# Patient Record
Sex: Male | Born: 1960
Health system: Southern US, Community
[De-identification: ages and names within clinical notes are randomized; demographics above are authoritative.]

## PROBLEM LIST (undated history)

## (undated) ENCOUNTER — Emergency Department: Admission: EM | Source: Home / Self Care

## (undated) DIAGNOSIS — E669 Obesity, unspecified: Secondary | ICD-10-CM

## (undated) DIAGNOSIS — E785 Hyperlipidemia, unspecified: Secondary | ICD-10-CM

## (undated) DIAGNOSIS — E119 Type 2 diabetes mellitus without complications: Secondary | ICD-10-CM

## (undated) DIAGNOSIS — K219 Gastro-esophageal reflux disease without esophagitis: Secondary | ICD-10-CM

## (undated) DIAGNOSIS — I10 Essential (primary) hypertension: Secondary | ICD-10-CM

## (undated) DIAGNOSIS — J439 Emphysema, unspecified: Secondary | ICD-10-CM

## (undated) HISTORY — PX: KNEE ARTHROSCOPY: SUR90

## (undated) HISTORY — DX: Hyperlipidemia, unspecified: E78.5

## (undated) HISTORY — DX: Essential (primary) hypertension: I10

## (undated) HISTORY — DX: Emphysema, unspecified: J43.9

---

## 2004-03-14 HISTORY — PX: CHOLECYSTECTOMY: SHX55

## 2006-06-13 ENCOUNTER — Ambulatory Visit: Payer: Self-pay | Admitting: Family Medicine

## 2006-06-20 ENCOUNTER — Ambulatory Visit: Payer: Self-pay | Admitting: General Surgery

## 2013-04-08 ENCOUNTER — Ambulatory Visit: Payer: Self-pay | Admitting: Gastroenterology

## 2017-05-17 DIAGNOSIS — N182 Chronic kidney disease, stage 2 (mild): Secondary | ICD-10-CM | POA: Diagnosis not present

## 2017-05-17 DIAGNOSIS — I10 Essential (primary) hypertension: Secondary | ICD-10-CM | POA: Diagnosis not present

## 2017-05-17 DIAGNOSIS — E1129 Type 2 diabetes mellitus with other diabetic kidney complication: Secondary | ICD-10-CM | POA: Diagnosis not present

## 2017-05-24 DIAGNOSIS — E119 Type 2 diabetes mellitus without complications: Secondary | ICD-10-CM | POA: Diagnosis not present

## 2017-06-07 DIAGNOSIS — N182 Chronic kidney disease, stage 2 (mild): Secondary | ICD-10-CM | POA: Diagnosis not present

## 2017-06-07 DIAGNOSIS — K219 Gastro-esophageal reflux disease without esophagitis: Secondary | ICD-10-CM | POA: Diagnosis not present

## 2017-06-07 DIAGNOSIS — E1129 Type 2 diabetes mellitus with other diabetic kidney complication: Secondary | ICD-10-CM | POA: Diagnosis not present

## 2017-06-07 DIAGNOSIS — I10 Essential (primary) hypertension: Secondary | ICD-10-CM | POA: Diagnosis not present

## 2017-06-07 DIAGNOSIS — E7849 Other hyperlipidemia: Secondary | ICD-10-CM | POA: Diagnosis not present

## 2017-06-07 DIAGNOSIS — Z1389 Encounter for screening for other disorder: Secondary | ICD-10-CM | POA: Diagnosis not present

## 2017-07-18 DIAGNOSIS — E1129 Type 2 diabetes mellitus with other diabetic kidney complication: Secondary | ICD-10-CM | POA: Diagnosis not present

## 2017-07-18 DIAGNOSIS — I10 Essential (primary) hypertension: Secondary | ICD-10-CM | POA: Diagnosis not present

## 2017-07-18 DIAGNOSIS — N182 Chronic kidney disease, stage 2 (mild): Secondary | ICD-10-CM | POA: Diagnosis not present

## 2017-07-18 DIAGNOSIS — F17210 Nicotine dependence, cigarettes, uncomplicated: Secondary | ICD-10-CM | POA: Diagnosis not present

## 2017-10-04 DIAGNOSIS — E7849 Other hyperlipidemia: Secondary | ICD-10-CM | POA: Diagnosis not present

## 2017-10-04 DIAGNOSIS — I10 Essential (primary) hypertension: Secondary | ICD-10-CM | POA: Diagnosis not present

## 2017-10-04 DIAGNOSIS — E1129 Type 2 diabetes mellitus with other diabetic kidney complication: Secondary | ICD-10-CM | POA: Diagnosis not present

## 2017-10-04 DIAGNOSIS — N182 Chronic kidney disease, stage 2 (mild): Secondary | ICD-10-CM | POA: Diagnosis not present

## 2018-02-05 DIAGNOSIS — E7849 Other hyperlipidemia: Secondary | ICD-10-CM | POA: Diagnosis not present

## 2018-02-05 DIAGNOSIS — R740 Nonspecific elevation of levels of transaminase and lactic acid dehydrogenase [LDH]: Secondary | ICD-10-CM | POA: Diagnosis not present

## 2018-02-05 DIAGNOSIS — E1129 Type 2 diabetes mellitus with other diabetic kidney complication: Secondary | ICD-10-CM | POA: Diagnosis not present

## 2018-02-05 DIAGNOSIS — I10 Essential (primary) hypertension: Secondary | ICD-10-CM | POA: Diagnosis not present

## 2018-02-05 DIAGNOSIS — N182 Chronic kidney disease, stage 2 (mild): Secondary | ICD-10-CM | POA: Diagnosis not present

## 2018-03-13 DIAGNOSIS — K429 Umbilical hernia without obstruction or gangrene: Secondary | ICD-10-CM | POA: Diagnosis not present

## 2018-03-22 ENCOUNTER — Ambulatory Visit: Payer: Self-pay | Admitting: Surgery

## 2018-03-22 NOTE — H&P (Signed)
Subjective:   CC: umbilical hernia  HPI:  Joshua Weber is a 58 y.o. male who was referred by Lucas MallowKhary Stephen Carew, MD for evaluation of above. Symptoms were first noted a few weeks ago. Asymptomatic.  Noticed the bulge a few days after moving furniture.  Patient has no symptoms of  chronic constipation.    Past Medical History:  has a past medical history of Esophageal reflux and Obesity.  Past Surgical History:       Past Surgical History:  Procedure Laterality Date  . CHOLECYSTECTOMY    . KNEE ARTHROSCOPY Bilateral 1994, 1995   by Dr. Gerrit Heckaliff    Family History: family history includes Cancer in his father; High blood pressure (Hypertension) in his father.  Social History:  reports that he has been smoking. He has been smoking about 1.00 pack per day. He has never used smokeless tobacco. He reports current alcohol use. He reports that he does not use drugs.  Current Medications: has a current medication list which includes the following prescription(s): atorvastatin, azelastine, duke's magic mouthwash, fluticasone propionate, losartan, and metformin.  Allergies:     Allergies as of 03/21/2018  . (No Known Allergies)    ROS:  A 15 point review of systems was performed and pertinent positives and negatives noted in HPI   Objective:   BP 133/87   Pulse 78   Temp 36.4 C (97.6 F) (Oral)   Ht 193 cm (6\' 4" )   Wt (!) 135.6 kg (299 lb)   BMI 36.40 kg/m   Constitutional :  alert, appears stated age, cooperative and no distress  Lymphatics/Throat:  no asymmetry, masses, or scars  Respiratory:  clear to auscultation bilaterally  Cardiovascular:  regular rate and rhythm  Gastrointestinal: soft, non-tender; bowel sounds normal; no masses,  no organomegaly. umbilical hernia noted.  moderate and reducible  Musculoskeletal: Steady gait and movement  Skin: Cool and moist  Psychiatric: Normal affect, non-agitated, not confused       LABS:  n/a    RADS: n/a Assessment:       Umbilical hernia without obstruction and without gangrene [K42.9]  Obesity Smoking DM  Plan:   1. Umbilical hernia without obstruction and without gangrene [K42.9]   Discussed the risk of surgery including recurrence, which can be up to 50% in the case of incisional or complex hernias, possible use of prosthetic materials (mesh) and the increased risk of mesh infxn if used, bleeding, chronic pain, post-op infxn, post-op SBO or ileus, and possible re-operation to address said risks. The risks of general anesthetic, if used, includes MI, CVA, sudden death or even reaction to anesthetic medications also discussed. Alternatives include continued observation.  Benefits include possible symptom relief, prevention of incarceration, strangulation, enlargement in size over time, and the risk of emergency surgery in the face of strangulation.   Typical post-op recovery time of 3-5 days with 4-6 weeks of activity restrictions were also discussed.  ED return precautions given for sudden increase in pain, size of hernia with accompanying fever, nausea, and/or vomiting.  The patient verbalized understanding and all questions were answered to the patient's satisfaction.   2. I specifically stated he is at higher risk of complications including recurrence due to his current wt, DM, and smoking history.  I initially recommended postponing the surgery to see if we can continue to lose weight(states he has lost 30lbs over the past several months), try to quit smoking, and also re-evalute to see if his DM is medically optimized.  Pt had zero interest in waiting and was concerned that the hernia will become symptomatic so requested that we proceed with surgery now, specifically stating he understands the higher risk of complications in his current state.  Will proceed with scheduling at this time       Electronically signed by Sung Amabile, DO on 03/21/2018 10:03 AM

## 2018-03-22 NOTE — H&P (View-Only) (Signed)
Subjective:   CC: umbilical hernia  HPI:  Joshua Weber is a 58 y.o. male who was referred by Khary Stephen Carew, MD for evaluation of above. Symptoms were first noted a few weeks ago. Asymptomatic.  Noticed the bulge a few days after moving furniture.  Patient has no symptoms of  chronic constipation.    Past Medical History:  has a past medical history of Esophageal reflux and Obesity.  Past Surgical History:       Past Surgical History:  Procedure Laterality Date  . CHOLECYSTECTOMY    . KNEE ARTHROSCOPY Bilateral 1994, 1995   by Dr. Califf    Family History: family history includes Cancer in his father; High blood pressure (Hypertension) in his father.  Social History:  reports that he has been smoking. He has been smoking about 1.00 pack per day. He has never used smokeless tobacco. He reports current alcohol use. He reports that he does not use drugs.  Current Medications: has a current medication list which includes the following prescription(s): atorvastatin, azelastine, duke's magic mouthwash, fluticasone propionate, losartan, and metformin.  Allergies:     Allergies as of 03/21/2018  . (No Known Allergies)    ROS:  A 15 point review of systems was performed and pertinent positives and negatives noted in HPI   Objective:   BP 133/87   Pulse 78   Temp 36.4 C (97.6 F) (Oral)   Ht 193 cm (6' 4")   Wt (!) 135.6 kg (299 lb)   BMI 36.40 kg/m   Constitutional :  alert, appears stated age, cooperative and no distress  Lymphatics/Throat:  no asymmetry, masses, or scars  Respiratory:  clear to auscultation bilaterally  Cardiovascular:  regular rate and rhythm  Gastrointestinal: soft, non-tender; bowel sounds normal; no masses,  no organomegaly. umbilical hernia noted.  moderate and reducible  Musculoskeletal: Steady gait and movement  Skin: Cool and moist  Psychiatric: Normal affect, non-agitated, not confused       LABS:  n/a    RADS: n/a Assessment:       Umbilical hernia without obstruction and without gangrene [K42.9]  Obesity Smoking DM  Plan:   1. Umbilical hernia without obstruction and without gangrene [K42.9]   Discussed the risk of surgery including recurrence, which can be up to 50% in the case of incisional or complex hernias, possible use of prosthetic materials (mesh) and the increased risk of mesh infxn if used, bleeding, chronic pain, post-op infxn, post-op SBO or ileus, and possible re-operation to address said risks. The risks of general anesthetic, if used, includes MI, CVA, sudden death or even reaction to anesthetic medications also discussed. Alternatives include continued observation.  Benefits include possible symptom relief, prevention of incarceration, strangulation, enlargement in size over time, and the risk of emergency surgery in the face of strangulation.   Typical post-op recovery time of 3-5 days with 4-6 weeks of activity restrictions were also discussed.  ED return precautions given for sudden increase in pain, size of hernia with accompanying fever, nausea, and/or vomiting.  The patient verbalized understanding and all questions were answered to the patient's satisfaction.   2. I specifically stated he is at higher risk of complications including recurrence due to his current wt, DM, and smoking history.  I initially recommended postponing the surgery to see if we can continue to lose weight(states he has lost 30lbs over the past several months), try to quit smoking, and also re-evalute to see if his DM is medically optimized.    Pt had zero interest in waiting and was concerned that the hernia will become symptomatic so requested that we proceed with surgery now, specifically stating he understands the higher risk of complications in his current state.  Will proceed with scheduling at this time       Electronically signed by Sung Amabile, DO on 03/21/2018 10:03 AM

## 2018-03-28 ENCOUNTER — Encounter
Admission: RE | Admit: 2018-03-28 | Discharge: 2018-03-28 | Disposition: A | Discharge status: Home / Self Care | Payer: PRIVATE HEALTH INSURANCE | Source: Ambulatory Visit | Attending: Surgery | Admitting: Surgery

## 2018-03-28 ENCOUNTER — Other Ambulatory Visit: Payer: Self-pay

## 2018-03-28 DIAGNOSIS — Z01818 Encounter for other preprocedural examination: Secondary | ICD-10-CM | POA: Diagnosis present

## 2018-03-28 DIAGNOSIS — E119 Type 2 diabetes mellitus without complications: Secondary | ICD-10-CM | POA: Diagnosis not present

## 2018-03-28 HISTORY — DX: Gastro-esophageal reflux disease without esophagitis: K21.9

## 2018-03-28 HISTORY — DX: Type 2 diabetes mellitus without complications: E11.9

## 2018-03-28 HISTORY — DX: Obesity, unspecified: E66.9

## 2018-03-28 LAB — CBC
HCT: 48.6 % (ref 39.0–52.0)
Hemoglobin: 16 g/dL (ref 13.0–17.0)
MCH: 30.4 pg (ref 26.0–34.0)
MCHC: 32.9 g/dL (ref 30.0–36.0)
MCV: 92.4 fL (ref 80.0–100.0)
Platelets: 328 10*3/uL (ref 150–400)
RBC: 5.26 MIL/uL (ref 4.22–5.81)
RDW: 13 % (ref 11.5–15.5)
WBC: 8.1 10*3/uL (ref 4.0–10.5)
nRBC: 0 % (ref 0.0–0.2)

## 2018-03-28 LAB — BASIC METABOLIC PANEL
Anion gap: 8 (ref 5–15)
BUN: 23 mg/dL — ABNORMAL HIGH (ref 6–20)
CO2: 23 mmol/L (ref 22–32)
Calcium: 9.3 mg/dL (ref 8.9–10.3)
Chloride: 107 mmol/L (ref 98–111)
Creatinine, Ser: 0.96 mg/dL (ref 0.61–1.24)
GFR calc Af Amer: >60 mL/min (ref >60)
GFR calc non Af Amer: >60 mL/min (ref >60)
Glucose, Bld: 177 mg/dL — ABNORMAL HIGH (ref 70–99)
Potassium: 3.9 mmol/L (ref 3.5–5.1)
Sodium: 138 mmol/L (ref 135–145)

## 2018-03-28 NOTE — Patient Instructions (Addendum)
Your procedure is scheduled on: Tuesday, April 03, 2018  Report to THE SECOND FLOOR OF THE MEDICAL MALL     DO NOT STOP ON THE FIRST FLOOR TO REGISTER  To find out your arrival time please call [phone] between 1PM - 3PM on Monday, April 02, 2018  Remember: Instructions that are not followed completely may result in serious medical risk,  up to and including death, or upon the discretion of your surgeon and anesthesiologist your  surgery may need to be rescheduled.     _X__ 1. Do not eat food after midnight the night before your procedure.                 No gum chewing or hard candies.                       ABSOLUTELY NOTHING SOLID IN YOUR MOUTH AFTER MIDNIGHT                 You may drink clear liquids up to 2 hours before you are scheduled to arrive for your surgery-                   DO not drink clear liquids within 2 hours of the start of your surgery.                  Clear Liquids include:  water, apple juice without pulp, clear carbohydrate                 drink such as Clearfast of Gatorade, Black Coffee or Tea (Do not add                 anything to coffee or tea).YOU MAY ADD SUGAR BUT DO NOT ADD DAIRY PRODUCTS  __X__2.  On the morning of surgery brush your teeth with toothpaste and water,                     You may rinse your mouth with mouthwash if you wish.                           Do not swallow any toothpaste of mouthwash.     _X__ 3.  No Alcohol for 24 hours before or after surgery.   _X__ 4.  Do Not Smoke or use e-cigarettes For 24 Hours Prior to Your Surgery.                 Do not use any chewable tobacco products for at least 6 hours prior to                 surgery.  ____  5.  Bring all medications with you on the day of surgery if instructed.   ____  6.  Notify your doctor if there is any change in your medical condition      (cold, fever, infections).     Do not wear jewelry, make-up, hairpins, clips or nail polish. Do  not wear lotions, powders, or perfumes. You may wear deodorant. Do not shave 48 hours prior to surgery. Men may shave face and neck. Do not bring valuables to the hospital.    [facility] is not responsible for any belongings or valuables.  Contacts, dentures or bridgework may not be worn into surgery. Leave your suitcase in the car. After surgery it may be brought to your room. For  patients admitted to the hospital, discharge time is determined by your treatment team.   Patients discharged the day of surgery will not be allowed to drive home.   Please read over the following fact sheets that you were given:   PREPARING FOR SURGERY   _X___ Take these medicines the morning of surgery with A SIP OF WATER:    1.NEXIUM  2. LIPITOR  3. VASCEPA  4.  5.  6.  ____ Fleet Enema (as directed)   __X__ Use CHG Soap as directed  ____ Use inhalers on the day of surgery  _X___ [facility] 2 [facility] to [facility]   ____ Take 1/2 of usual insulin dose the night before surgery. No insulin the morning          of surgery.   _X___ Stop ALL ASPIRIN PRODUCTS AS OF NOW  _X___ Stop Anti-inflammatories AS OF NOW                THAT INCLUDES IBUPROFEN / MOTRIN / ADVIL / ALEVE / NAPROXEN                                YOU MAY TAKE TYLENOL AT ANY TIME   ____ Stop supplements until after surgery.    ____ Bring C-Pap to the hospital.   DO NOT TAKE LOSARTAN ON THE DAY OF SURGERY BUT DO TAKE IT EVERY DAY UP UNTIL THEN.  WEAR LOOSE AND COMFORTABLE CLOTHES

## 2018-04-02 MED ORDER — CEFAZOLIN SODIUM-DEXTROSE 2-4 GM/100ML-% IV SOLN
2.0000 g | INTRAVENOUS | Status: AC
  Administered 2018-04-03: 2 g via INTRAVENOUS

## 2018-04-03 ENCOUNTER — Encounter
Admission: RE | Disposition: A | Discharge status: Home / Self Care | Payer: Self-pay | Source: Ambulatory Visit | Attending: Surgery

## 2018-04-03 ENCOUNTER — Ambulatory Visit: Payer: PRIVATE HEALTH INSURANCE | Admitting: Registered Nurse

## 2018-04-03 ENCOUNTER — Ambulatory Visit
Admission: RE | Admit: 2018-04-03 | Discharge: 2018-04-03 | Disposition: A | Discharge status: Home / Self Care | Payer: PRIVATE HEALTH INSURANCE | Source: Ambulatory Visit | Attending: Surgery | Admitting: Surgery

## 2018-04-03 ENCOUNTER — Other Ambulatory Visit: Payer: Self-pay

## 2018-04-03 ENCOUNTER — Encounter: Payer: Self-pay | Admitting: *Deleted

## 2018-04-03 DIAGNOSIS — K219 Gastro-esophageal reflux disease without esophagitis: Secondary | ICD-10-CM | POA: Diagnosis not present

## 2018-04-03 DIAGNOSIS — E669 Obesity, unspecified: Secondary | ICD-10-CM | POA: Diagnosis not present

## 2018-04-03 DIAGNOSIS — F17210 Nicotine dependence, cigarettes, uncomplicated: Secondary | ICD-10-CM | POA: Insufficient documentation

## 2018-04-03 DIAGNOSIS — K429 Umbilical hernia without obstruction or gangrene: Secondary | ICD-10-CM | POA: Insufficient documentation

## 2018-04-03 DIAGNOSIS — Z6836 Body mass index (BMI) 36.0-36.9, adult: Secondary | ICD-10-CM | POA: Insufficient documentation

## 2018-04-03 DIAGNOSIS — Z8249 Family history of ischemic heart disease and other diseases of the circulatory system: Secondary | ICD-10-CM | POA: Diagnosis not present

## 2018-04-03 DIAGNOSIS — Z7984 Long term (current) use of oral hypoglycemic drugs: Secondary | ICD-10-CM | POA: Diagnosis not present

## 2018-04-03 DIAGNOSIS — Z79899 Other long term (current) drug therapy: Secondary | ICD-10-CM | POA: Diagnosis not present

## 2018-04-03 DIAGNOSIS — Z7951 Long term (current) use of inhaled steroids: Secondary | ICD-10-CM | POA: Diagnosis not present

## 2018-04-03 DIAGNOSIS — E119 Type 2 diabetes mellitus without complications: Secondary | ICD-10-CM | POA: Diagnosis not present

## 2018-04-03 HISTORY — PX: UMBILICAL HERNIA REPAIR: SHX196

## 2018-04-03 LAB — GLUCOSE, CAPILLARY
Glucose-Capillary: 130 mg/dL — ABNORMAL HIGH (ref 70–99)
Glucose-Capillary: 151 mg/dL — ABNORMAL HIGH (ref 70–99)

## 2018-04-03 SURGERY — REPAIR, HERNIA, UMBILICAL, ADULT
Anesthesia: General

## 2018-04-03 MED ORDER — SUCCINYLCHOLINE CHLORIDE 20 MG/ML IJ SOLN
INTRAMUSCULAR | Status: DC | PRN
  Administered 2018-04-03: 200 mg via INTRAVENOUS

## 2018-04-03 MED ORDER — ROCURONIUM BROMIDE 50 MG/5ML IV SOLN
INTRAVENOUS | Status: AC
  Filled 2018-04-03: qty 1

## 2018-04-03 MED ORDER — ACETAMINOPHEN 500 MG PO TABS
ORAL_TABLET | ORAL | Status: AC
  Administered 2018-04-03: 1000 mg via ORAL
  Filled 2018-04-03: qty 2

## 2018-04-03 MED ORDER — DOCUSATE SODIUM 100 MG PO CAPS: 100.0000 mg | ORAL_CAPSULE | Freq: Two times a day (BID) | ORAL | 0 refills | Status: AC | PRN

## 2018-04-03 MED ORDER — DEXAMETHASONE SODIUM PHOSPHATE 10 MG/ML IJ SOLN
INTRAMUSCULAR | Status: DC | PRN
  Administered 2018-04-03: 4 mg via INTRAVENOUS

## 2018-04-03 MED ORDER — PROPOFOL 10 MG/ML IV BOLUS
INTRAVENOUS | Status: AC
  Filled 2018-04-03: qty 20

## 2018-04-03 MED ORDER — DEXAMETHASONE SODIUM PHOSPHATE 10 MG/ML IJ SOLN
INTRAMUSCULAR | Status: AC
  Filled 2018-04-03: qty 1

## 2018-04-03 MED ORDER — ALBUTEROL SULFATE HFA 108 (90 BASE) MCG/ACT IN AERS
INHALATION_SPRAY | RESPIRATORY_TRACT | Status: DC | PRN
  Administered 2018-04-03: 4 via RESPIRATORY_TRACT

## 2018-04-03 MED ORDER — CEFAZOLIN SODIUM-DEXTROSE 2-4 GM/100ML-% IV SOLN
INTRAVENOUS | Status: AC
  Filled 2018-04-03: qty 100

## 2018-04-03 MED ORDER — FENTANYL CITRATE (PF) 250 MCG/5ML IJ SOLN
INTRAMUSCULAR | Status: AC
  Filled 2018-04-03: qty 5

## 2018-04-03 MED ORDER — ACETAMINOPHEN 500 MG PO TABS
1000.0000 mg | ORAL_TABLET | ORAL | Status: AC
  Administered 2018-04-03: 1000 mg via ORAL

## 2018-04-03 MED ORDER — PROPOFOL 10 MG/ML IV BOLUS
INTRAVENOUS | Status: DC | PRN
  Administered 2018-04-03 (×2): 200 mg via INTRAVENOUS

## 2018-04-03 MED ORDER — FENTANYL CITRATE (PF) 100 MCG/2ML IJ SOLN
INTRAMUSCULAR | Status: DC | PRN
  Administered 2018-04-03 (×2): 100 ug via INTRAVENOUS

## 2018-04-03 MED ORDER — ALBUTEROL SULFATE HFA 108 (90 BASE) MCG/ACT IN AERS
INHALATION_SPRAY | RESPIRATORY_TRACT | Status: AC
  Filled 2018-04-03: qty 6.7

## 2018-04-03 MED ORDER — EPHEDRINE SULFATE 50 MG/ML IJ SOLN
INTRAMUSCULAR | Status: DC | PRN
  Administered 2018-04-03 (×3): 5 mg via INTRAVENOUS

## 2018-04-03 MED ORDER — LIDOCAINE HCL (PF) 2 % IJ SOLN
INTRAMUSCULAR | Status: AC
  Filled 2018-04-03: qty 10

## 2018-04-03 MED ORDER — ACETAMINOPHEN 325 MG PO TABS: 650.0000 mg | ORAL_TABLET | Freq: Three times a day (TID) | ORAL | 0 refills | Status: AC | PRN

## 2018-04-03 MED ORDER — BUPIVACAINE LIPOSOME 1.3 % IJ SUSP
INTRAMUSCULAR | Status: DC | PRN
  Administered 2018-04-03: 20 mL

## 2018-04-03 MED ORDER — SODIUM CHLORIDE 0.9 % IV SOLN
INTRAVENOUS | Status: DC
  Administered 2018-04-03: 16:00:00 via INTRAVENOUS

## 2018-04-03 MED ORDER — ONDANSETRON HCL 4 MG/2ML IJ SOLN
INTRAMUSCULAR | Status: AC
  Filled 2018-04-03: qty 2

## 2018-04-03 MED ORDER — LIDOCAINE HCL (PF) 4 % IJ SOLN
INTRAMUSCULAR | Status: DC | PRN
  Administered 2018-04-03: 3 mL via RESPIRATORY_TRACT

## 2018-04-03 MED ORDER — IBUPROFEN 800 MG PO TABS: 800.0000 mg | ORAL_TABLET | Freq: Three times a day (TID) | ORAL | 0 refills | Status: AC | PRN

## 2018-04-03 MED ORDER — HYDROCODONE-ACETAMINOPHEN 5-325 MG PO TABS: 1.0000 | ORAL_TABLET | Freq: Four times a day (QID) | ORAL | 0 refills | Status: AC | PRN

## 2018-04-03 MED ORDER — MIDAZOLAM HCL 2 MG/2ML IJ SOLN
INTRAMUSCULAR | Status: DC | PRN
  Administered 2018-04-03: 2 mg via INTRAVENOUS

## 2018-04-03 MED ORDER — PHENYLEPHRINE HCL 10 MG/ML IJ SOLN
INTRAMUSCULAR | Status: DC | PRN
  Administered 2018-04-03 (×3): 100 ug via INTRAVENOUS
  Administered 2018-04-03 (×3): 200 ug via INTRAVENOUS

## 2018-04-03 MED ORDER — BUPIVACAINE-EPINEPHRINE (PF) 0.5% -1:200000 IJ SOLN
INTRAMUSCULAR | Status: DC | PRN
  Administered 2018-04-03: 25 mL via PERINEURAL
  Administered 2018-04-03: 5 mL via PERINEURAL

## 2018-04-03 MED ORDER — MIDAZOLAM HCL 2 MG/2ML IJ SOLN
INTRAMUSCULAR | Status: AC
  Filled 2018-04-03: qty 2

## 2018-04-03 MED ORDER — LIDOCAINE HCL (CARDIAC) PF 100 MG/5ML IV SOSY
PREFILLED_SYRINGE | INTRAVENOUS | Status: DC | PRN
  Administered 2018-04-03: 100 mg via INTRAVENOUS

## 2018-04-03 MED ORDER — ONDANSETRON HCL 4 MG/2ML IJ SOLN
INTRAMUSCULAR | Status: DC | PRN
  Administered 2018-04-03: 4 mg via INTRAVENOUS

## 2018-04-03 MED ORDER — HYDROMORPHONE HCL 1 MG/ML IJ SOLN: 0.2500 mg | INTRAMUSCULAR | Status: DC | PRN

## 2018-04-03 MED ORDER — CHLORHEXIDINE GLUCONATE CLOTH 2 % EX PADS: 6.0000 | MEDICATED_PAD | Freq: Once | CUTANEOUS | Status: DC

## 2018-04-03 SURGICAL SUPPLY — 33 items
BLADE SURG 15 STRL LF DISP TIS (BLADE) ×1 IMPLANT
BLADE SURG 15 STRL SS (BLADE) ×1
CANISTER SUCT 1200ML W/VALVE (MISCELLANEOUS) ×2 IMPLANT
CHLORAPREP W/TINT 26ML (MISCELLANEOUS) ×2 IMPLANT
COVER WAND RF STERILE (DRAPES) ×1 IMPLANT
DERMABOND ADVANCED (GAUZE/BANDAGES/DRESSINGS) ×1
DERMABOND ADVANCED .7 DNX12 (GAUZE/BANDAGES/DRESSINGS) ×1 IMPLANT
DRAPE LAPAROTOMY 77X122 PED (DRAPES) ×2 IMPLANT
ELECT REM PT RETURN 9FT ADLT (ELECTROSURGICAL) ×2
ELECTRODE REM PT RTRN 9FT ADLT (ELECTROSURGICAL) ×1 IMPLANT
GLOVE BIOGEL PI IND STRL 7.0 (GLOVE) ×1 IMPLANT
GLOVE BIOGEL PI INDICATOR 7.0 (GLOVE) ×1
GLOVE SURG SYN 7.0 (GLOVE) ×2 IMPLANT
GLOVE SURG SYN 7.0 PF PI (GLOVE) ×1 IMPLANT
GOWN STRL REUS W/ TWL LRG LVL3 (GOWN DISPOSABLE) ×3 IMPLANT
GOWN STRL REUS W/TWL LRG LVL3 (GOWN DISPOSABLE) ×3
KIT TURNOVER KIT A (KITS) ×2 IMPLANT
LABEL OR SOLS (LABEL) ×2 IMPLANT
MESH VENTRALEX ST 2.5 CRC MED (Mesh General) ×1 IMPLANT
NEEDLE HYPO 22GX1.5 SAFETY (NEEDLE) ×2 IMPLANT
NS IRRIG 500ML POUR BTL (IV SOLUTION) ×2 IMPLANT
PACK BASIN MINOR ARMC (MISCELLANEOUS) ×2 IMPLANT
SUT ETHIBOND NAB MO 7 #0 18IN (SUTURE) ×3 IMPLANT
SUT MNCRL 4-0 (SUTURE) ×1
SUT MNCRL 4-0 27XMFL (SUTURE) ×1
SUT VIC AB 2-0 SH 27 (SUTURE) ×1
SUT VIC AB 2-0 SH 27XBRD (SUTURE) ×1 IMPLANT
SUT VIC AB 3-0 SH 27 (SUTURE) ×1
SUT VIC AB 3-0 SH 27X BRD (SUTURE) ×1 IMPLANT
SUTURE MNCRL 4-0 27XMF (SUTURE) ×1 IMPLANT
SYR 10ML LL (SYRINGE) ×4 IMPLANT
TOWEL OR 17X26 4PK STRL BLUE (TOWEL DISPOSABLE) ×2 IMPLANT
WATER STERILE IRR 1000ML POUR (IV SOLUTION) ×2 IMPLANT

## 2018-04-03 NOTE — Progress Notes (Signed)
Pt. complaining of pain right calf , states fells like a cramp . Right calf inspected , no redness no swelling , no inflamed areas . intructed to work calf muscles , walk and drink plenty of fluids I have attempted to contact this patient by phone with the following results: Marland Kitchen MD if does not improve.

## 2018-04-03 NOTE — Op Note (Signed)
Preoperative diagnosis: umbilical hernia, reducible Postoperative diagnosis: same  Procedure:  Open umbilical hernia repair with mesh  Anesthesia: LMA  Surgeon: Sung AmabileIsami Elvie Maines  Wound Classification: Clean  Specimen: none  Complications: None  Estimated Blood Loss: minimal  Indications:see HPI  Findings: 1. 2.5cm x 2.5cm reducible umbilical hernia 4. Tension free repair achieved with mesh and suture 5. Adequate hemostasis  Description of procedure: The patient was brought to the operating room and general anesthesia was induced. A time-out was completed verifying correct patient, procedure, site, positioning, and implant(s) and/or special equipment prior to beginning this procedure. Antibiotics were administered prior to making the incision. SCDs placed. The anterior abdominal wall was prepped and draped in the standard sterile fashion.   An infraumbilical incision was made after infusing the preplanned incision with half percent Marcaine.  Dissection carried down to fascia where the umbilical stalk was noted.  The stalk was transected and there was noted to be a 2.5cm x 2.5cm umbilical hernia.  The preperitoneal fat contents were dissected off the surrounding structures and reduced.  Hemostasis was confirmed prior to reducing the actual contents. Due to size of mesh and very thin fascia secondary to his body habitus, decision was made to proceed with a mesh repair.    A 6.4cm umbilical mesh was placed within the preperitoneal cavity through the present defect and secured in place on the abdominal wall using interrupted 0 Ethibond sutures.  Once the mesh was noted to be laying flat and secured to the abdominal wall the defect itself was primary closed using 0 Ethibond in a interrupted fashion.  The fascia as well as the skin incision was then infused with 20 mL's of Exparel expanded with 25 mL's of half percent Marcaine.  After confirming hemostasis, the umbilical stalk was reattached to the  abdominal wall using 2-0 Vicryl and the wound was irrigated and closed in a multilayer fashion, using 3-0 Vicryl for the deep dermal layer in an interrupted fashion and running 4-0 Monocryl in a subcuticular fashion.  Wound was then dressed with Dermabond.  Patient was then successfully awakened and transferred to PACU in stable condition.  At the end of the procedure sponge and instrument counts were correct

## 2018-04-03 NOTE — Anesthesia Procedure Notes (Signed)
Procedure Name: Intubation Date/Time: 04/03/2018 4:25 PM Performed by: Jackalyn Lombard, RN Pre-anesthesia Checklist: Patient identified, Patient being monitored, Timeout performed, Emergency Drugs available and Suction available Patient Re-evaluated:Patient Re-evaluated prior to induction Oxygen Delivery Method: Circle system utilized Preoxygenation: Pre-oxygenation with 100% oxygen Induction Type: IV induction Ventilation: Mask ventilation without difficulty and Oral airway inserted - appropriate to patient size Laryngoscope Size: McGraph and 4 (1st DL with MAC 4, grade III view; BMV without difficulty; proceed with McGraph 4, grade I view, successful intubation) Grade View: Grade I Tube type: Oral Tube size: 8.0 mm Number of attempts: 1 Airway Equipment and Method: Stylet,  Video-laryngoscopy,  Patient positioned with wedge pillow and LTA kit utilized Placement Confirmation: ETT inserted through vocal cords under direct vision,  positive ETCO2 and breath sounds checked- equal and bilateral Secured at: 23 cm Tube secured with: Tape Dental Injury: Teeth and Oropharynx as per pre-operative assessment

## 2018-04-03 NOTE — Interval H&P Note (Signed)
History and Physical Interval Note:  04/03/2018 4:02 PM  Joshua Weber  has presented today for surgery, with the diagnosis of UMBILICAL HERNIA WITHOUT OBSTRUCTION OR GANGRENE  The various methods of treatment have been discussed with the patient and family. After consideration of risks, benefits and other options for treatment, the patient has consented to  Procedure(s): HERNIA REPAIR UMBILICAL ADULT (N/A) as a surgical intervention .  The patient's history has been reviewed, patient examined, no change in status, stable for surgery.  I have reviewed the patient's chart and labs.  Questions were answered to the patient's satisfaction.     Joshua Weber Tonna Boehringer

## 2018-04-03 NOTE — Anesthesia Preprocedure Evaluation (Signed)
Anesthesia Evaluation  Patient identified by MRN, date of birth, ID band Patient awake    Reviewed: Allergy & Precautions, H&P , NPO status , Patient's Chart, lab work & pertinent test results  Airway Mallampati: III  TM Distance: <3 FB    Comment: Large neck Dental  (+) Chipped   Pulmonary sleep apnea (suspected) , Current Smoker,           Cardiovascular (-) angina(-) Past MI negative cardio ROS       Neuro/Psych negative neurological ROS  negative psych ROS   GI/Hepatic Neg liver ROS, GERD  Controlled,  Endo/Other  diabetes  Renal/GU      Musculoskeletal   Abdominal   Peds  Hematology negative hematology ROS (+)   Anesthesia Other Findings Past Medical History: No date: Diabetes mellitus without complication (HCC) No date: GERD (gastroesophageal reflux disease) No date: Obesity  Past Surgical History: 2006: CHOLECYSTECTOMY 1994, 1995: KNEE ARTHROSCOPY; Bilateral  BMI    Body Mass Index:  36.46 kg/m      Reproductive/Obstetrics negative OB ROS                             Anesthesia Physical Anesthesia Plan  ASA: III  Anesthesia Plan: General ETT   Post-op Pain Management:    Induction:   PONV Risk Score and Plan: Ondansetron, Dexamethasone, Midazolam and Treatment may vary due to age or medical condition  Airway Management Planned:   Additional Equipment:   Intra-op Plan:   Post-operative Plan:   Informed Consent: I have reviewed the patients History and Physical, chart, labs and discussed the procedure including the risks, benefits and alternatives for the proposed anesthesia with the patient or authorized representative who has indicated his/her understanding and acceptance.     Dental Advisory Given  Plan Discussed with: Anesthesiologist and CRNA  Anesthesia Plan Comments:         Anesthesia Quick Evaluation

## 2018-04-03 NOTE — Discharge Instructions (Signed)
AMBULATORY SURGERY  DISCHARGE INSTRUCTIONS   1) The drugs that you were given will stay in your system until tomorrow so for the next 24 hours you should not:  A) Drive an automobile B) Make any legal decisions C) Drink any alcoholic beverage   2) You may resume regular meals tomorrow.  Today it is better to start with liquids and gradually work up to solid foods.  You may eat anything you prefer, but it is better to start with liquids, then soup and crackers, and gradually work up to solid foods.   3) Please notify your doctor immediately if you have any unusual bleeding, trouble breathing, redness and pain at the surgery site, drainage, fever, or pain not relieved by medication. 4)   5) Your post-operative visit with Dr.                                     is: Date:                        Time:    Please call to schedule your post-operative visit.  6) Additional Instructions:      Open Hernia Repair, Adult, Care After These instructions give you information about caring for yourself after your procedure. Your doctor may also give you more specific instructions. If you have problems or questions, contact your doctor. Follow these instructions at home: Surgical cut (incision) care   Follow instructions from your doctor about how to take care of your surgical cut area. Make sure you: ? Wash your hands with soap and water before you change your bandage (dressing). If you cannot use soap and water, use hand sanitizer. ? Change your bandage as told by your doctor. ? Leave stitches (sutures), skin glue, or skin tape (adhesive) strips in place. They may need to stay in place for 2 weeks or longer. If tape strips get loose and curl up, you may trim the loose edges. Do not remove tape strips completely unless your doctor says it is okay.  Check your surgical cut every day for signs of infection. Check for: ? More redness, swelling, or pain. ? More fluid or blood. ? Warmth. ? Pus  or a bad smell. Activity  Do not drive or use heavy machinery while taking prescription pain medicine. Do not drive until your doctor says it is okay.  Until your doctor says it is okay: ? Do not lift anything that is heavier than 10 lb (4.5 kg). ? Do not play contact sports.  Return to your normal activities as told by your doctor. Ask your doctor what activities are safe. General instructions  To prevent or treat having a hard time pooping (constipation) while you are taking prescription pain medicine, your doctor may recommend that you: ? Drink enough fluid to keep your pee (urine) clear or pale yellow. ? Take over-the-counter or prescription medicines. ? Eat foods that are high in fiber, such as fresh fruits and vegetables, whole grains, and beans. ? Limit foods that are high in fat and processed sugars, such as fried and sweet foods.  tylenol and advil as needed for discomfort.  Please alternate between the two every four hours as needed for pain.    Use narcotics, if prescribed, only when tylenol and motrin is not enough to control pain.  325-650mg  every 8hrs to max of 4000mg /24hrs (including the 325mg  in  every norco dose) for the tylenol.    Advil up to 800mg  per dose every 8hrs as needed for pain.    Do not take baths, swim, or use a hot tub until your doctor says it is okay.  OK TO SHOWER IN 24HRS  Keep all follow-up visits as told by your doctor. This is important. Contact a doctor if:  You develop a rash.  You have more redness, swelling, or pain around your surgical cut.  You have more fluid or blood coming from your surgical cut.  Your surgical cut feels warm to the touch.  You have pus or a bad smell coming from your surgical cut.  You have a fever or chills.  You have blood in your poop (stool).  You have not pooped in 2-3 days.  Medicine does not help your pain. Get help right away if:  You have chest pain or you are short of breath.  You feel  light-headed.  You feel weak and dizzy (feel faint).  You have very bad pain.  You throw up (vomit) and your pain is worse. This information is not intended to replace advice given to you by your health care provider. Make sure you discuss any questions you have with your health care provider. Document Released: 03/21/2014 Document Revised: 09/18/2015 Document Reviewed: 08/12/2015 Elsevier Interactive Patient Education  2019 [employer].

## 2018-04-03 NOTE — Transfer of Care (Signed)
Immediate Anesthesia Transfer of Care Note  Patient: Joshua Weber  Procedure(s) Performed: HERNIA REPAIR UMBILICAL ADULT (N/A )  Patient Location: PACU  Anesthesia Type:General  Level of Consciousness: drowsy and patient cooperative  Airway & Oxygen Therapy: Patient Spontanous Breathing and Patient connected to face mask oxygen  Post-op Assessment: Report given to RN and Post -op Vital signs reviewed and stable  Post vital signs: Reviewed and stable  Last Vitals:  Vitals Value Taken Time  BP 157/90 04/03/2018  5:37 PM  Temp 36.5 C 04/03/2018  5:37 PM  Pulse 92 04/03/2018  5:40 PM  Resp 16 04/03/2018  5:40 PM  SpO2 99 % 04/03/2018  5:40 PM  Vitals shown include unvalidated device data.  Last Pain:  Vitals:   04/03/18 1737  TempSrc:   PainSc: 0-No pain         Complications: No apparent anesthesia complications

## 2018-04-03 NOTE — Anesthesia Post-op Follow-up Note (Signed)
Anesthesia QCDR form completed.        

## 2018-04-04 ENCOUNTER — Encounter: Payer: Self-pay | Admitting: Surgery

## 2018-04-05 NOTE — Anesthesia Postprocedure Evaluation (Signed)
Anesthesia Post Note  Patient: TONEY TOW  Procedure(s) Performed: HERNIA REPAIR UMBILICAL ADULT (N/A )  Patient location during evaluation: PACU Anesthesia Type: General Level of consciousness: awake and alert Pain management: pain level controlled Vital Signs Assessment: post-procedure vital signs reviewed and stable Respiratory status: spontaneous breathing, nonlabored ventilation and respiratory function stable Cardiovascular status: blood pressure returned to baseline and stable Postop Assessment: no apparent nausea or vomiting Anesthetic complications: no     Last Vitals:  Vitals:   04/03/18 1817 04/03/18 1837  BP: 120/73 121/73  Pulse: 85   Resp: 16 16  Temp: (!) 36.3 C (!) 26.7 C  SpO2: 93% 94%    Last Pain:  Vitals:   04/04/18 0842  TempSrc:   PainSc: 3                  Jovita Gamma

## 2019-03-28 DIAGNOSIS — I10 Essential (primary) hypertension: Secondary | ICD-10-CM | POA: Diagnosis not present

## 2019-03-28 DIAGNOSIS — E1129 Type 2 diabetes mellitus with other diabetic kidney complication: Secondary | ICD-10-CM | POA: Diagnosis not present

## 2019-03-28 DIAGNOSIS — N182 Chronic kidney disease, stage 2 (mild): Secondary | ICD-10-CM | POA: Diagnosis not present

## 2019-04-04 DIAGNOSIS — N08 Glomerular disorders in diseases classified elsewhere: Secondary | ICD-10-CM | POA: Diagnosis not present

## 2019-04-04 DIAGNOSIS — E669 Obesity, unspecified: Secondary | ICD-10-CM | POA: Diagnosis not present

## 2019-04-04 DIAGNOSIS — E1129 Type 2 diabetes mellitus with other diabetic kidney complication: Secondary | ICD-10-CM | POA: Diagnosis not present

## 2019-04-04 DIAGNOSIS — I10 Essential (primary) hypertension: Secondary | ICD-10-CM | POA: Diagnosis not present

## 2019-08-07 DIAGNOSIS — E1129 Type 2 diabetes mellitus with other diabetic kidney complication: Secondary | ICD-10-CM | POA: Diagnosis not present

## 2019-08-07 DIAGNOSIS — I10 Essential (primary) hypertension: Secondary | ICD-10-CM | POA: Diagnosis not present

## 2019-08-07 DIAGNOSIS — E669 Obesity, unspecified: Secondary | ICD-10-CM | POA: Diagnosis not present

## 2019-08-07 DIAGNOSIS — N08 Glomerular disorders in diseases classified elsewhere: Secondary | ICD-10-CM | POA: Diagnosis not present

## 2019-11-22 DIAGNOSIS — E785 Hyperlipidemia, unspecified: Secondary | ICD-10-CM | POA: Diagnosis not present

## 2019-11-22 DIAGNOSIS — E1129 Type 2 diabetes mellitus with other diabetic kidney complication: Secondary | ICD-10-CM | POA: Diagnosis not present

## 2019-11-22 DIAGNOSIS — E291 Testicular hypofunction: Secondary | ICD-10-CM | POA: Diagnosis not present

## 2019-11-22 DIAGNOSIS — Z0000 Encounter for general adult medical examination without abnormal findings: Secondary | ICD-10-CM | POA: Diagnosis not present

## 2019-11-22 DIAGNOSIS — Z125 Encounter for screening for malignant neoplasm of prostate: Secondary | ICD-10-CM | POA: Diagnosis not present

## 2019-11-28 DIAGNOSIS — I10 Essential (primary) hypertension: Secondary | ICD-10-CM | POA: Diagnosis not present

## 2019-11-28 DIAGNOSIS — Z23 Encounter for immunization: Secondary | ICD-10-CM | POA: Diagnosis not present

## 2019-11-28 DIAGNOSIS — Z0000 Encounter for general adult medical examination without abnormal findings: Secondary | ICD-10-CM | POA: Diagnosis not present

## 2020-01-31 DIAGNOSIS — Z1212 Encounter for screening for malignant neoplasm of rectum: Secondary | ICD-10-CM | POA: Diagnosis not present

## 2020-04-13 DIAGNOSIS — E1129 Type 2 diabetes mellitus with other diabetic kidney complication: Secondary | ICD-10-CM | POA: Diagnosis not present

## 2020-06-10 ENCOUNTER — Telehealth: Payer: Self-pay | Admitting: *Deleted

## 2020-06-10 DIAGNOSIS — Z122 Encounter for screening for malignant neoplasm of respiratory organs: Secondary | ICD-10-CM

## 2020-06-10 DIAGNOSIS — F17200 Nicotine dependence, unspecified, uncomplicated: Secondary | ICD-10-CM

## 2020-06-10 DIAGNOSIS — Z87891 Personal history of nicotine dependence: Secondary | ICD-10-CM

## 2020-06-10 NOTE — Telephone Encounter (Signed)
Received referral for initial lung cancer screening scan. Contacted patient and obtained smoking history,(current 86 pack year) as well as answering questions related to screening process. Patient denies signs of lung cancer such as weight loss or hemoptysis. Patient denies comorbidity that would prevent curative treatment if lung cancer were found. Patient is scheduled for shared decision making visit and CT scan on 06/17/20 1015am.

## 2020-06-17 ENCOUNTER — Other Ambulatory Visit: Payer: Self-pay

## 2020-06-17 ENCOUNTER — Encounter: Payer: Self-pay | Admitting: Oncology

## 2020-06-17 ENCOUNTER — Ambulatory Visit
Admission: RE | Admit: 2020-06-17 | Discharge: 2020-06-17 | Disposition: A | Discharge status: Home / Self Care | Payer: BC Managed Care – PPO | Source: Ambulatory Visit | Attending: Oncology | Admitting: Oncology

## 2020-06-17 ENCOUNTER — Inpatient Hospital Stay: Payer: BC Managed Care – PPO | Attending: Oncology | Admitting: Oncology

## 2020-06-17 DIAGNOSIS — Z122 Encounter for screening for malignant neoplasm of respiratory organs: Secondary | ICD-10-CM | POA: Insufficient documentation

## 2020-06-17 DIAGNOSIS — Z87891 Personal history of nicotine dependence: Secondary | ICD-10-CM | POA: Insufficient documentation

## 2020-06-17 DIAGNOSIS — F17200 Nicotine dependence, unspecified, uncomplicated: Secondary | ICD-10-CM | POA: Insufficient documentation

## 2020-06-17 NOTE — Progress Notes (Signed)
Virtual Visit via Video Note  I connected with Joshua Weber on 06/17/20 at 10:15 AM EDT by a video enabled telemedicine application and verified that I am speaking with the correct person using two identifiers.  Location: Patient: [facility] Provider: Clinic   I discussed the limitations of evaluation and management by telemedicine and the availability of in person appointments. The patient expressed understanding and agreed to proceed.  I discussed the assessment and treatment plan with the patient. The patient was provided an opportunity to ask questions and all were answered. The patient agreed with the plan and demonstrated an understanding of the instructions.   The patient was advised to call back or seek an in-person evaluation if the symptoms worsen or if the condition fails to improve as anticipated.   In accordance with CMS guidelines, patient has met eligibility criteria including age, absence of signs or symptoms of lung cancer.  Social History   Tobacco Use  . Smoking status: Current Every Day Smoker    Packs/day: 2.00    Years: 43.00    Pack years: 86.00    Types: Cigarettes  . Smokeless tobacco: Never Used  Vaping Use  . Vaping Use: Never used  Substance Use Topics  . Drug use: Never      A shared decision-making session was conducted prior to the performance of CT scan. This includes one or more decision aids, includes benefits and harms of screening, follow-up diagnostic testing, over-diagnosis, false positive rate, and total radiation exposure.   Counseling on the importance of adherence to annual lung cancer LDCT screening, impact of co-morbidities, and ability or willingness to undergo diagnosis and treatment is imperative for compliance of the program.   Counseling on the importance of continued smoking cessation for former smokers; the importance of smoking cessation for current smokers, and information about tobacco cessation interventions have been given to patient  including [facility] and 1800 quit  programs.   Written order for lung cancer screening with LDCT has been given to the patient and any and all questions have been answered to the best of my abilities.    Yearly follow up will be coordinated by Burgess Estelle, Thoracic Navigator.  I provided 15 minutes of face-to-face video visit time during this encounter, and > 50% was spent counseling as documented under my assessment & plan.   Jacquelin Hawking, NP

## 2020-06-19 ENCOUNTER — Telehealth: Payer: Self-pay | Admitting: *Deleted

## 2020-06-19 NOTE — Telephone Encounter (Signed)
Notified patient of LDCT lung cancer screening program results with recommendation for 12 month follow up imaging. Also notified of incidental findings noted below and is encouraged to discuss further with PCP who will receive a copy of this note and/or the CT report. Patient verbalizes understanding.   IMPRESSION: 1. Lung-RADS 2, benign appearance or behavior. Continue annual screening with low-dose chest CT without contrast in 12 months. 2.  Aortic Atherosclerois (ICD10-170.0)  

## 2020-06-24 ENCOUNTER — Ambulatory Visit: Payer: Self-pay

## 2020-07-09 DIAGNOSIS — E785 Hyperlipidemia, unspecified: Secondary | ICD-10-CM | POA: Diagnosis not present

## 2020-07-09 DIAGNOSIS — E291 Testicular hypofunction: Secondary | ICD-10-CM | POA: Diagnosis not present

## 2020-07-09 DIAGNOSIS — E1129 Type 2 diabetes mellitus with other diabetic kidney complication: Secondary | ICD-10-CM | POA: Diagnosis not present

## 2020-07-09 DIAGNOSIS — I10 Essential (primary) hypertension: Secondary | ICD-10-CM | POA: Diagnosis not present

## 2020-08-25 DIAGNOSIS — G8929 Other chronic pain: Secondary | ICD-10-CM | POA: Diagnosis not present

## 2020-08-25 DIAGNOSIS — M1732 Unilateral post-traumatic osteoarthritis, left knee: Secondary | ICD-10-CM | POA: Diagnosis not present

## 2020-08-25 DIAGNOSIS — M25562 Pain in left knee: Secondary | ICD-10-CM | POA: Diagnosis not present

## 2020-09-17 DIAGNOSIS — I10 Essential (primary) hypertension: Secondary | ICD-10-CM | POA: Diagnosis not present

## 2020-09-17 DIAGNOSIS — M25511 Pain in right shoulder: Secondary | ICD-10-CM | POA: Diagnosis not present

## 2020-09-17 DIAGNOSIS — R945 Abnormal results of liver function studies: Secondary | ICD-10-CM | POA: Diagnosis not present

## 2020-09-17 DIAGNOSIS — M25562 Pain in left knee: Secondary | ICD-10-CM | POA: Diagnosis not present

## 2020-09-17 DIAGNOSIS — E1129 Type 2 diabetes mellitus with other diabetic kidney complication: Secondary | ICD-10-CM | POA: Diagnosis not present

## 2020-09-17 DIAGNOSIS — M25512 Pain in left shoulder: Secondary | ICD-10-CM | POA: Diagnosis not present

## 2020-10-15 DIAGNOSIS — E1129 Type 2 diabetes mellitus with other diabetic kidney complication: Secondary | ICD-10-CM | POA: Diagnosis not present

## 2020-10-15 DIAGNOSIS — I129 Hypertensive chronic kidney disease with stage 1 through stage 4 chronic kidney disease, or unspecified chronic kidney disease: Secondary | ICD-10-CM | POA: Diagnosis not present

## 2020-11-23 DIAGNOSIS — M130 Polyarthritis, unspecified: Secondary | ICD-10-CM | POA: Diagnosis not present

## 2020-12-16 DIAGNOSIS — M130 Polyarthritis, unspecified: Secondary | ICD-10-CM | POA: Diagnosis not present

## 2021-01-13 DIAGNOSIS — I129 Hypertensive chronic kidney disease with stage 1 through stage 4 chronic kidney disease, or unspecified chronic kidney disease: Secondary | ICD-10-CM | POA: Diagnosis not present

## 2021-01-13 DIAGNOSIS — E1129 Type 2 diabetes mellitus with other diabetic kidney complication: Secondary | ICD-10-CM | POA: Diagnosis not present

## 2021-01-13 DIAGNOSIS — I700 Atherosclerosis of aorta: Secondary | ICD-10-CM | POA: Diagnosis not present

## 2021-01-13 DIAGNOSIS — Z23 Encounter for immunization: Secondary | ICD-10-CM | POA: Diagnosis not present

## 2021-01-25 DIAGNOSIS — M7542 Impingement syndrome of left shoulder: Secondary | ICD-10-CM | POA: Diagnosis not present

## 2021-01-25 DIAGNOSIS — M7541 Impingement syndrome of right shoulder: Secondary | ICD-10-CM | POA: Diagnosis not present

## 2021-03-10 DIAGNOSIS — M7541 Impingement syndrome of right shoulder: Secondary | ICD-10-CM | POA: Diagnosis not present

## 2021-03-10 DIAGNOSIS — M7551 Bursitis of right shoulder: Secondary | ICD-10-CM | POA: Diagnosis not present

## 2021-03-10 DIAGNOSIS — M7552 Bursitis of left shoulder: Secondary | ICD-10-CM | POA: Diagnosis not present

## 2021-03-10 DIAGNOSIS — M7542 Impingement syndrome of left shoulder: Secondary | ICD-10-CM | POA: Diagnosis not present

## 2021-04-13 DIAGNOSIS — E1129 Type 2 diabetes mellitus with other diabetic kidney complication: Secondary | ICD-10-CM | POA: Diagnosis not present

## 2021-04-13 DIAGNOSIS — F17210 Nicotine dependence, cigarettes, uncomplicated: Secondary | ICD-10-CM | POA: Diagnosis not present

## 2021-04-14 DIAGNOSIS — M130 Polyarthritis, unspecified: Secondary | ICD-10-CM | POA: Diagnosis not present

## 2021-05-10 DIAGNOSIS — M7542 Impingement syndrome of left shoulder: Secondary | ICD-10-CM | POA: Diagnosis not present

## 2021-05-10 DIAGNOSIS — M7541 Impingement syndrome of right shoulder: Secondary | ICD-10-CM | POA: Diagnosis not present

## 2021-05-10 DIAGNOSIS — M7551 Bursitis of right shoulder: Secondary | ICD-10-CM | POA: Diagnosis not present

## 2021-05-10 DIAGNOSIS — M7552 Bursitis of left shoulder: Secondary | ICD-10-CM | POA: Diagnosis not present

## 2021-05-26 DIAGNOSIS — E1129 Type 2 diabetes mellitus with other diabetic kidney complication: Secondary | ICD-10-CM | POA: Diagnosis not present

## 2021-07-01 ENCOUNTER — Other Ambulatory Visit: Payer: Self-pay

## 2021-07-01 DIAGNOSIS — Z87891 Personal history of nicotine dependence: Secondary | ICD-10-CM

## 2021-07-01 DIAGNOSIS — Z122 Encounter for screening for malignant neoplasm of respiratory organs: Secondary | ICD-10-CM

## 2021-07-01 DIAGNOSIS — F17200 Nicotine dependence, unspecified, uncomplicated: Secondary | ICD-10-CM

## 2021-07-12 ENCOUNTER — Ambulatory Visit
Admission: RE | Admit: 2021-07-12 | Discharge: 2021-07-12 | Disposition: A | Discharge status: Home / Self Care | Payer: BC Managed Care – PPO | Source: Ambulatory Visit | Attending: Acute Care | Admitting: Acute Care

## 2021-07-12 DIAGNOSIS — Z87891 Personal history of nicotine dependence: Secondary | ICD-10-CM | POA: Insufficient documentation

## 2021-07-12 DIAGNOSIS — Z122 Encounter for screening for malignant neoplasm of respiratory organs: Secondary | ICD-10-CM | POA: Insufficient documentation

## 2021-07-12 DIAGNOSIS — F17200 Nicotine dependence, unspecified, uncomplicated: Secondary | ICD-10-CM | POA: Diagnosis not present

## 2021-07-13 ENCOUNTER — Other Ambulatory Visit: Payer: Self-pay

## 2021-07-13 DIAGNOSIS — Z122 Encounter for screening for malignant neoplasm of respiratory organs: Secondary | ICD-10-CM

## 2021-07-13 DIAGNOSIS — Z87891 Personal history of nicotine dependence: Secondary | ICD-10-CM

## 2021-07-13 DIAGNOSIS — F17210 Nicotine dependence, cigarettes, uncomplicated: Secondary | ICD-10-CM

## 2021-07-21 DIAGNOSIS — E1129 Type 2 diabetes mellitus with other diabetic kidney complication: Secondary | ICD-10-CM | POA: Diagnosis not present

## 2021-07-21 DIAGNOSIS — F17210 Nicotine dependence, cigarettes, uncomplicated: Secondary | ICD-10-CM | POA: Diagnosis not present

## 2021-09-02 DIAGNOSIS — D1723 Benign lipomatous neoplasm of skin and subcutaneous tissue of right leg: Secondary | ICD-10-CM | POA: Diagnosis not present

## 2021-09-02 DIAGNOSIS — Z86018 Personal history of other benign neoplasm: Secondary | ICD-10-CM | POA: Diagnosis not present

## 2021-09-02 DIAGNOSIS — L578 Other skin changes due to chronic exposure to nonionizing radiation: Secondary | ICD-10-CM | POA: Diagnosis not present

## 2021-09-02 DIAGNOSIS — D485 Neoplasm of uncertain behavior of skin: Secondary | ICD-10-CM | POA: Diagnosis not present

## 2021-09-28 DIAGNOSIS — Z125 Encounter for screening for malignant neoplasm of prostate: Secondary | ICD-10-CM | POA: Diagnosis not present

## 2021-09-28 DIAGNOSIS — E785 Hyperlipidemia, unspecified: Secondary | ICD-10-CM | POA: Diagnosis not present

## 2021-09-28 DIAGNOSIS — R739 Hyperglycemia, unspecified: Secondary | ICD-10-CM | POA: Diagnosis not present

## 2021-09-28 DIAGNOSIS — E291 Testicular hypofunction: Secondary | ICD-10-CM | POA: Diagnosis not present

## 2021-09-28 DIAGNOSIS — Z0000 Encounter for general adult medical examination without abnormal findings: Secondary | ICD-10-CM | POA: Diagnosis not present

## 2021-10-05 DIAGNOSIS — Z0000 Encounter for general adult medical examination without abnormal findings: Secondary | ICD-10-CM | POA: Diagnosis not present

## 2021-10-05 DIAGNOSIS — E1129 Type 2 diabetes mellitus with other diabetic kidney complication: Secondary | ICD-10-CM | POA: Diagnosis not present

## 2021-10-05 DIAGNOSIS — Z1331 Encounter for screening for depression: Secondary | ICD-10-CM | POA: Diagnosis not present

## 2021-10-05 DIAGNOSIS — R82998 Other abnormal findings in urine: Secondary | ICD-10-CM | POA: Diagnosis not present

## 2021-11-09 DIAGNOSIS — J329 Chronic sinusitis, unspecified: Secondary | ICD-10-CM | POA: Diagnosis not present

## 2021-11-09 DIAGNOSIS — J029 Acute pharyngitis, unspecified: Secondary | ICD-10-CM | POA: Diagnosis not present

## 2021-11-09 DIAGNOSIS — Z03818 Encounter for observation for suspected exposure to other biological agents ruled out: Secondary | ICD-10-CM | POA: Diagnosis not present

## 2021-11-09 DIAGNOSIS — B9689 Other specified bacterial agents as the cause of diseases classified elsewhere: Secondary | ICD-10-CM | POA: Diagnosis not present

## 2021-12-07 DIAGNOSIS — F17210 Nicotine dependence, cigarettes, uncomplicated: Secondary | ICD-10-CM | POA: Diagnosis not present

## 2021-12-07 DIAGNOSIS — E1129 Type 2 diabetes mellitus with other diabetic kidney complication: Secondary | ICD-10-CM | POA: Diagnosis not present

## 2022-01-14 DIAGNOSIS — Z03818 Encounter for observation for suspected exposure to other biological agents ruled out: Secondary | ICD-10-CM | POA: Diagnosis not present

## 2022-01-14 DIAGNOSIS — J019 Acute sinusitis, unspecified: Secondary | ICD-10-CM | POA: Diagnosis not present

## 2022-01-14 DIAGNOSIS — R062 Wheezing: Secondary | ICD-10-CM | POA: Diagnosis not present

## 2022-01-25 DIAGNOSIS — E119 Type 2 diabetes mellitus without complications: Secondary | ICD-10-CM | POA: Diagnosis not present

## 2022-02-25 DIAGNOSIS — Z23 Encounter for immunization: Secondary | ICD-10-CM | POA: Diagnosis not present

## 2022-02-25 DIAGNOSIS — E1129 Type 2 diabetes mellitus with other diabetic kidney complication: Secondary | ICD-10-CM | POA: Diagnosis not present

## 2022-02-25 DIAGNOSIS — I129 Hypertensive chronic kidney disease with stage 1 through stage 4 chronic kidney disease, or unspecified chronic kidney disease: Secondary | ICD-10-CM | POA: Diagnosis not present

## 2022-04-07 DIAGNOSIS — F17210 Nicotine dependence, cigarettes, uncomplicated: Secondary | ICD-10-CM | POA: Diagnosis not present

## 2022-04-07 DIAGNOSIS — E1129 Type 2 diabetes mellitus with other diabetic kidney complication: Secondary | ICD-10-CM | POA: Diagnosis not present

## 2022-04-13 DIAGNOSIS — J3489 Other specified disorders of nose and nasal sinuses: Secondary | ICD-10-CM | POA: Diagnosis not present

## 2022-04-13 DIAGNOSIS — J328 Other chronic sinusitis: Secondary | ICD-10-CM | POA: Diagnosis not present

## 2022-04-13 DIAGNOSIS — J329 Chronic sinusitis, unspecified: Secondary | ICD-10-CM | POA: Diagnosis not present

## 2022-04-24 IMAGING — CT CT CHEST LUNG CANCER SCREENING LOW DOSE W/O CM
2 of 5 series · 15 of 40 positions shown, 18 images · non-contrast
Comparison: None.

CLINICAL DATA: 59-year-old male with 86 pack-year history of
smoking. Lung cancer screening.

EXAM:
CT CHEST WITHOUT CONTRAST LOW-DOSE FOR LUNG CANCER SCREENING
TECHNIQUE: Multidetector CT imaging of the chest was performed following the
standard protocol without IV contrast.

[Series 3: lung 1.00 · axial · 0.93mm/px · z∈[-1254,-943]mm · 12 of 345 slices shown, 15 images]
[im 17/345  mediastinal]
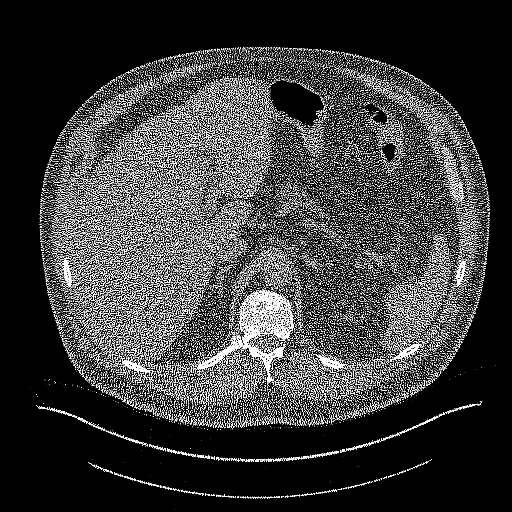
[im 17/345  lung]
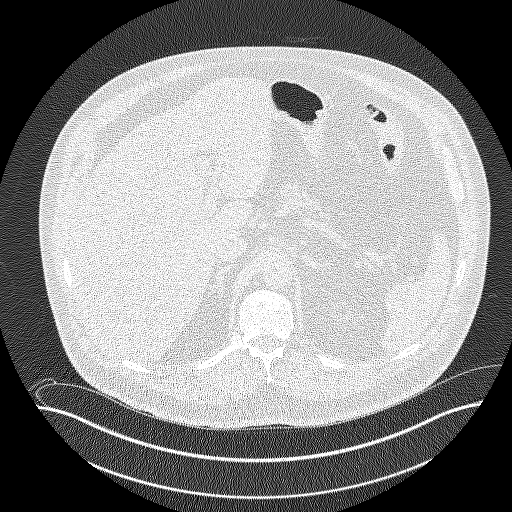
[im 50/345  lung]
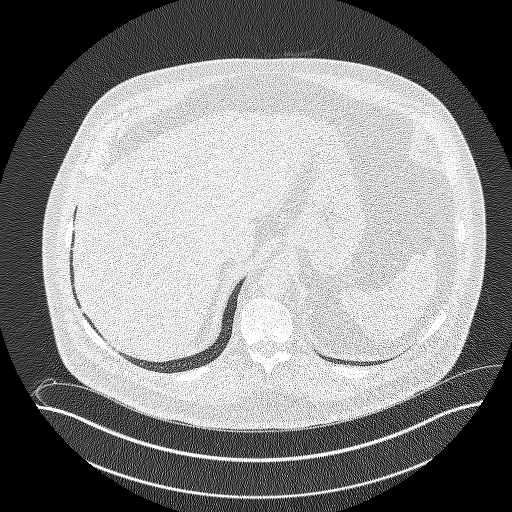
[im 82/345  lung]
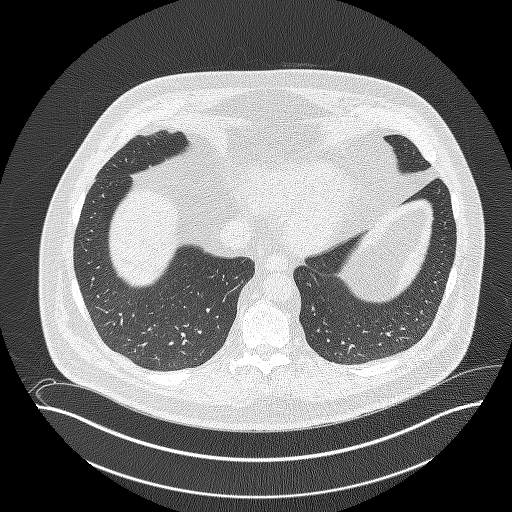
[im 99/345  lung]
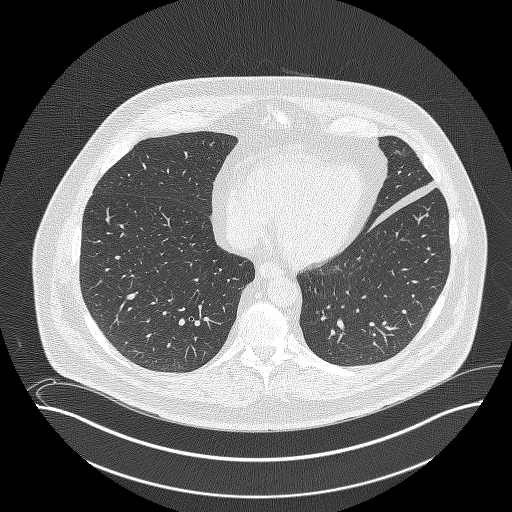
[im 132/345  mediastinal]
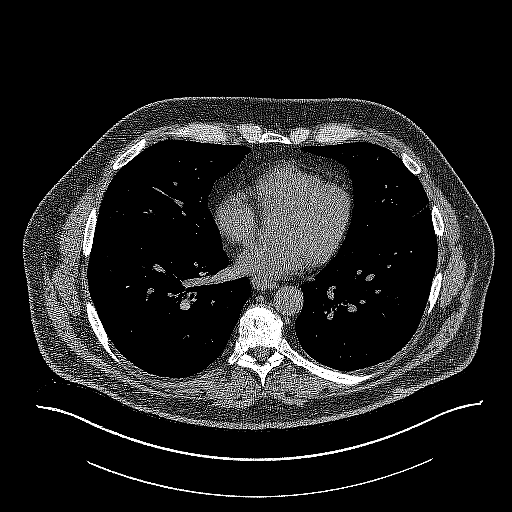
[im 132/345  lung]
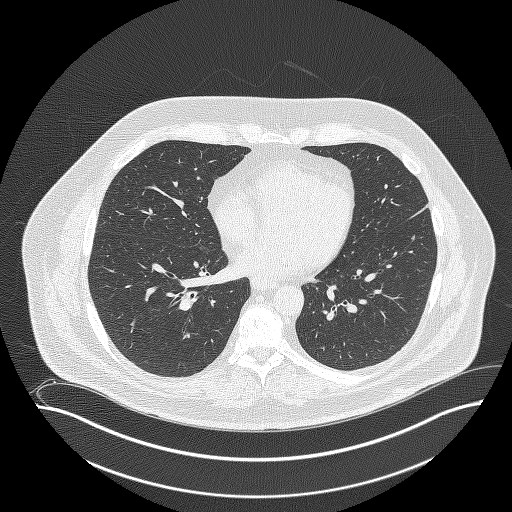
[im 164/345  lung]
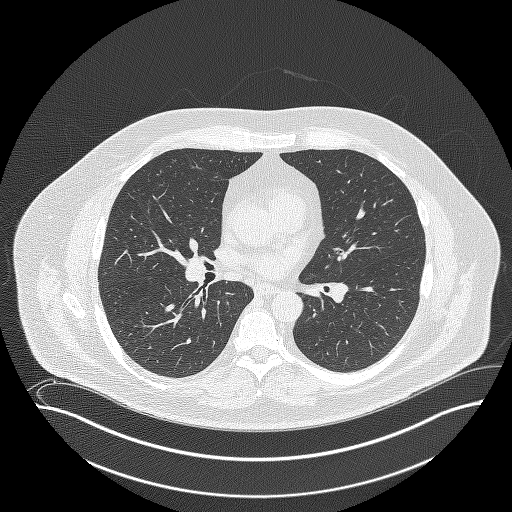
[im 181/345  lung]
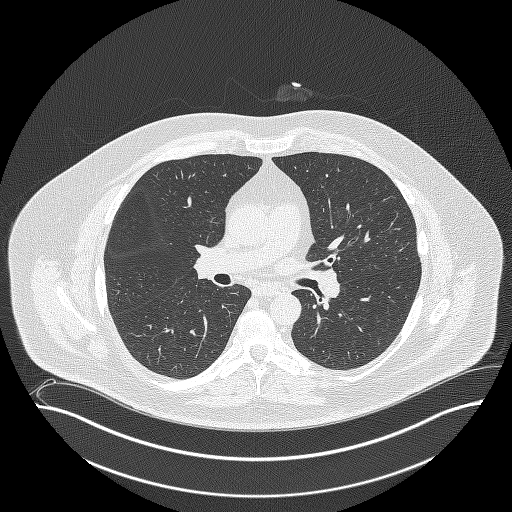
[im 213/345  lung]
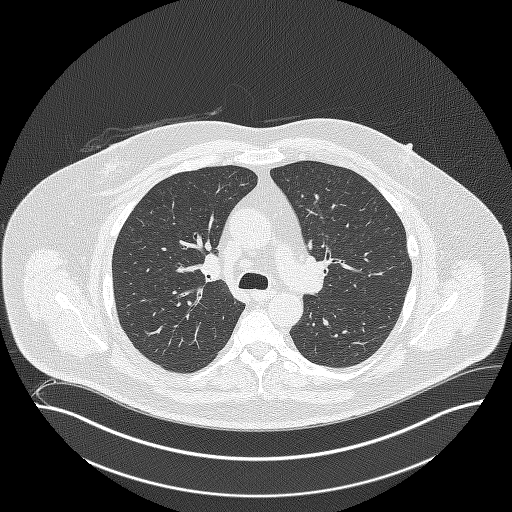
[im 246/345  mediastinal]
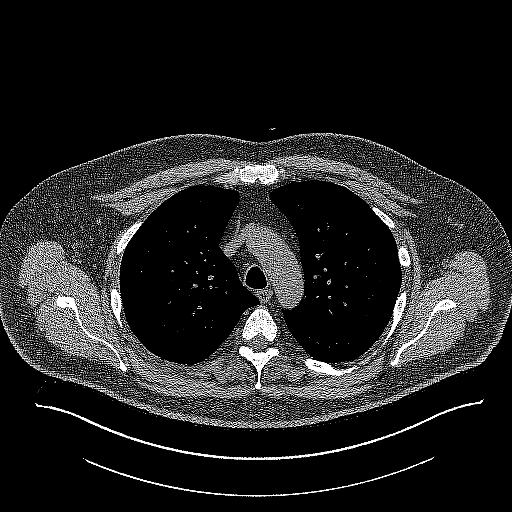
[im 246/345  lung]
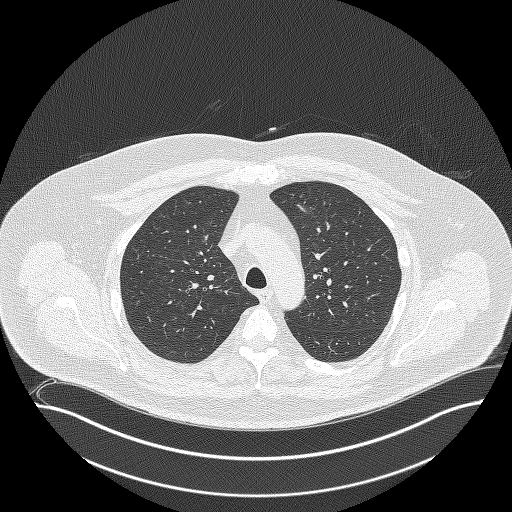
[im 263/345  lung]
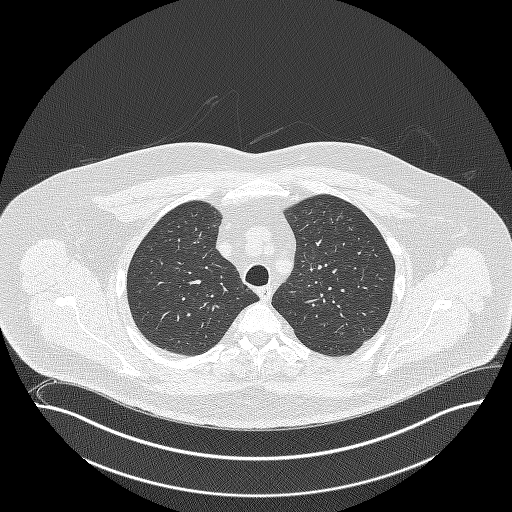
[im 295/345  lung]
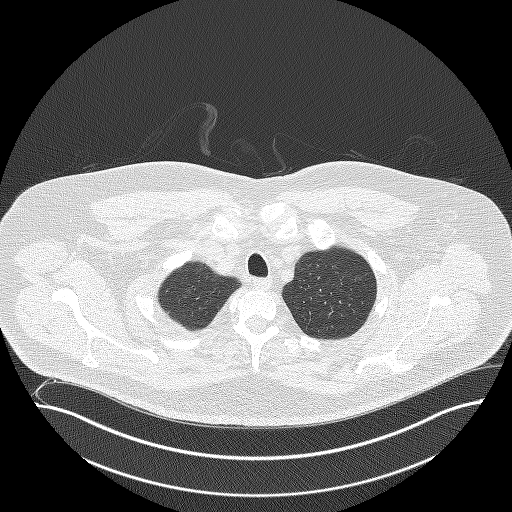
[im 328/345  lung]
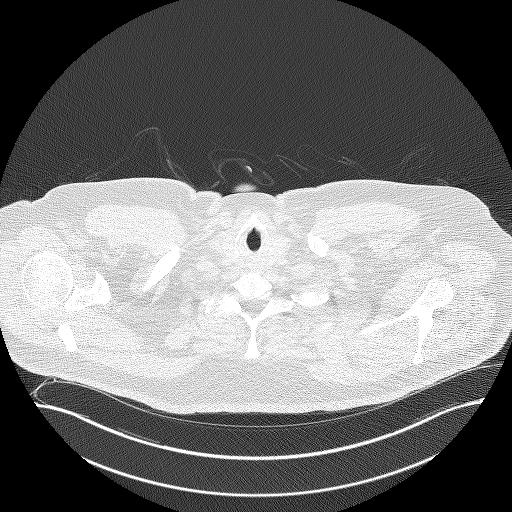

[Series 5: coronals lung 1.00 cor · coronal · 0.67mm/px · 3 of 413 slices shown]
[im 83/413  lung]
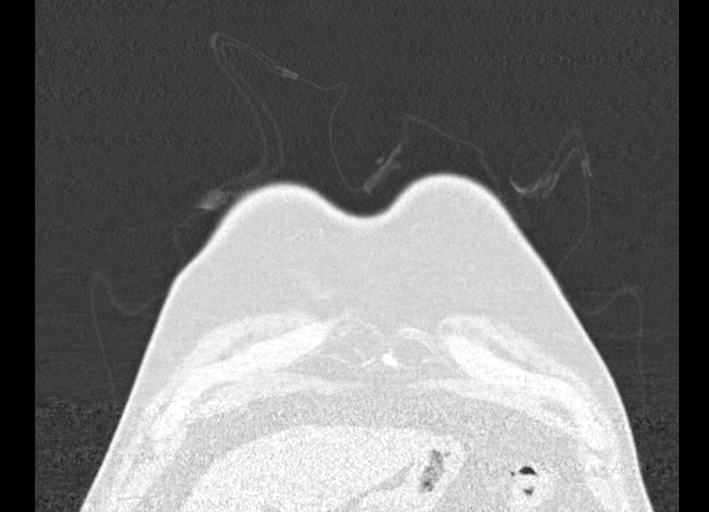
[im 165/413  lung]
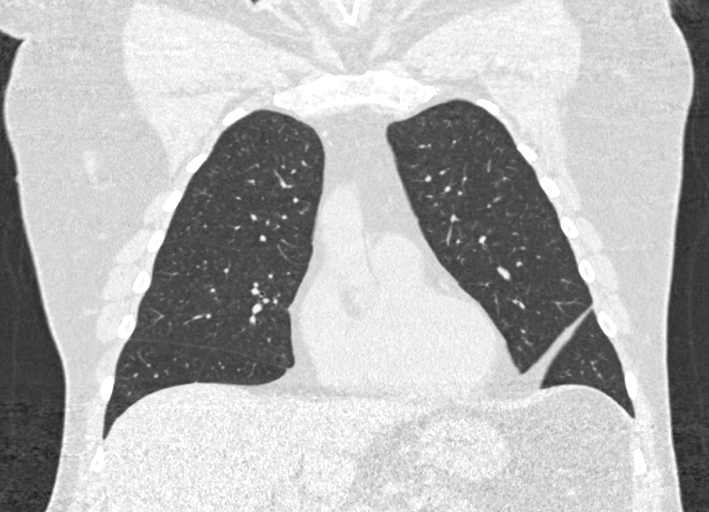
[im 248/413  lung]
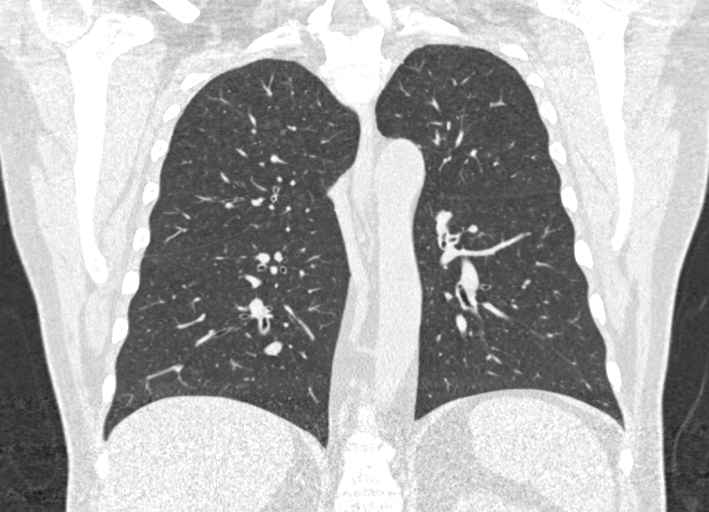

[15 of 40 positions shown; findings below may reference images not displayed]

FINDINGS: Cardiovascular: The heart size is normal. No substantial pericardial
effusion. Coronary artery calcification is evident. Atherosclerotic
calcification is noted in the wall of the thoracic aorta.

Mediastinum/Nodes: No mediastinal lymphadenopathy. No evidence for
gross hilar lymphadenopathy although assessment is limited by the
lack of intravenous contrast on today's study. The esophagus has
normal imaging features. There is no axillary lymphadenopathy.

Lungs/Pleura: No suspicious pulmonary nodule or mass. 5.4 mm
perifissural nodule identified in the right middle lobe. No focal
airspace consolidation. No pleural effusion.

Upper Abdomen: Unremarkable

Musculoskeletal: No worrisome lytic or sclerotic osseous
abnormality.
IMPRESSION: 1. Lung-RADS 2, benign appearance or behavior. Continue annual
screening with low-dose chest CT without contrast in 12 months.
2.  Aortic Atherosclerois ([id number]-170.0)

## 2022-05-11 DIAGNOSIS — J3489 Other specified disorders of nose and nasal sinuses: Secondary | ICD-10-CM | POA: Diagnosis not present

## 2022-05-18 DIAGNOSIS — J3489 Other specified disorders of nose and nasal sinuses: Secondary | ICD-10-CM | POA: Diagnosis not present

## 2022-05-25 DIAGNOSIS — J3489 Other specified disorders of nose and nasal sinuses: Secondary | ICD-10-CM | POA: Diagnosis not present

## 2022-07-05 ENCOUNTER — Other Ambulatory Visit: Payer: Self-pay | Admitting: Acute Care

## 2022-07-05 DIAGNOSIS — Z87891 Personal history of nicotine dependence: Secondary | ICD-10-CM

## 2022-07-05 DIAGNOSIS — Z122 Encounter for screening for malignant neoplasm of respiratory organs: Secondary | ICD-10-CM

## 2022-07-05 DIAGNOSIS — F17210 Nicotine dependence, cigarettes, uncomplicated: Secondary | ICD-10-CM

## 2022-07-12 DIAGNOSIS — F17210 Nicotine dependence, cigarettes, uncomplicated: Secondary | ICD-10-CM | POA: Diagnosis not present

## 2022-07-12 DIAGNOSIS — E785 Hyperlipidemia, unspecified: Secondary | ICD-10-CM | POA: Diagnosis not present

## 2022-07-12 DIAGNOSIS — E1129 Type 2 diabetes mellitus with other diabetic kidney complication: Secondary | ICD-10-CM | POA: Diagnosis not present

## 2022-07-14 ENCOUNTER — Ambulatory Visit: Payer: BC Managed Care – PPO

## 2022-08-30 DIAGNOSIS — E1129 Type 2 diabetes mellitus with other diabetic kidney complication: Secondary | ICD-10-CM | POA: Diagnosis not present

## 2022-08-30 DIAGNOSIS — F17210 Nicotine dependence, cigarettes, uncomplicated: Secondary | ICD-10-CM | POA: Diagnosis not present

## 2022-09-05 DIAGNOSIS — Z03818 Encounter for observation for suspected exposure to other biological agents ruled out: Secondary | ICD-10-CM | POA: Diagnosis not present

## 2022-09-05 DIAGNOSIS — R062 Wheezing: Secondary | ICD-10-CM | POA: Diagnosis not present

## 2022-09-05 DIAGNOSIS — R509 Fever, unspecified: Secondary | ICD-10-CM | POA: Diagnosis not present

## 2022-11-01 ENCOUNTER — Ambulatory Visit
Admission: RE | Admit: 2022-11-01 | Discharge: 2022-11-01 | Disposition: A | Discharge status: Home / Self Care | Payer: BC Managed Care – PPO | Source: Ambulatory Visit | Attending: Acute Care | Admitting: Acute Care

## 2022-11-01 DIAGNOSIS — Z122 Encounter for screening for malignant neoplasm of respiratory organs: Secondary | ICD-10-CM | POA: Diagnosis not present

## 2022-11-01 DIAGNOSIS — F17210 Nicotine dependence, cigarettes, uncomplicated: Secondary | ICD-10-CM | POA: Insufficient documentation

## 2022-11-01 DIAGNOSIS — Z87891 Personal history of nicotine dependence: Secondary | ICD-10-CM | POA: Diagnosis not present

## 2022-11-07 DIAGNOSIS — E1129 Type 2 diabetes mellitus with other diabetic kidney complication: Secondary | ICD-10-CM | POA: Diagnosis not present

## 2022-11-07 DIAGNOSIS — E785 Hyperlipidemia, unspecified: Secondary | ICD-10-CM | POA: Diagnosis not present

## 2022-11-07 DIAGNOSIS — Z125 Encounter for screening for malignant neoplasm of prostate: Secondary | ICD-10-CM | POA: Diagnosis not present

## 2022-11-10 DIAGNOSIS — R82998 Other abnormal findings in urine: Secondary | ICD-10-CM | POA: Diagnosis not present

## 2022-11-10 DIAGNOSIS — I10 Essential (primary) hypertension: Secondary | ICD-10-CM | POA: Diagnosis not present

## 2022-11-10 DIAGNOSIS — Z1339 Encounter for screening examination for other mental health and behavioral disorders: Secondary | ICD-10-CM | POA: Diagnosis not present

## 2022-11-10 DIAGNOSIS — E1129 Type 2 diabetes mellitus with other diabetic kidney complication: Secondary | ICD-10-CM | POA: Diagnosis not present

## 2022-11-10 DIAGNOSIS — I129 Hypertensive chronic kidney disease with stage 1 through stage 4 chronic kidney disease, or unspecified chronic kidney disease: Secondary | ICD-10-CM | POA: Diagnosis not present

## 2022-11-10 DIAGNOSIS — Z1331 Encounter for screening for depression: Secondary | ICD-10-CM | POA: Diagnosis not present

## 2022-11-10 DIAGNOSIS — Z0000 Encounter for general adult medical examination without abnormal findings: Secondary | ICD-10-CM | POA: Diagnosis not present

## 2022-11-28 DIAGNOSIS — J349 Unspecified disorder of nose and nasal sinuses: Secondary | ICD-10-CM | POA: Diagnosis not present

## 2022-11-28 DIAGNOSIS — J3489 Other specified disorders of nose and nasal sinuses: Secondary | ICD-10-CM | POA: Diagnosis not present

## 2023-01-02 DIAGNOSIS — J3489 Other specified disorders of nose and nasal sinuses: Secondary | ICD-10-CM | POA: Diagnosis not present

## 2023-01-02 DIAGNOSIS — J349 Unspecified disorder of nose and nasal sinuses: Secondary | ICD-10-CM | POA: Diagnosis not present

## 2023-02-08 DIAGNOSIS — F17210 Nicotine dependence, cigarettes, uncomplicated: Secondary | ICD-10-CM | POA: Diagnosis not present

## 2023-02-08 DIAGNOSIS — E1129 Type 2 diabetes mellitus with other diabetic kidney complication: Secondary | ICD-10-CM | POA: Diagnosis not present

## 2023-03-29 DIAGNOSIS — L720 Epidermal cyst: Secondary | ICD-10-CM | POA: Diagnosis not present

## 2023-05-17 DIAGNOSIS — I129 Hypertensive chronic kidney disease with stage 1 through stage 4 chronic kidney disease, or unspecified chronic kidney disease: Secondary | ICD-10-CM | POA: Diagnosis not present

## 2023-05-17 DIAGNOSIS — J432 Centrilobular emphysema: Secondary | ICD-10-CM | POA: Diagnosis not present

## 2023-05-17 DIAGNOSIS — E1129 Type 2 diabetes mellitus with other diabetic kidney complication: Secondary | ICD-10-CM | POA: Diagnosis not present

## 2023-06-07 DIAGNOSIS — E1129 Type 2 diabetes mellitus with other diabetic kidney complication: Secondary | ICD-10-CM | POA: Diagnosis not present

## 2023-06-07 DIAGNOSIS — F17210 Nicotine dependence, cigarettes, uncomplicated: Secondary | ICD-10-CM | POA: Diagnosis not present

## 2023-09-19 DIAGNOSIS — Z125 Encounter for screening for malignant neoplasm of prostate: Secondary | ICD-10-CM | POA: Diagnosis not present

## 2023-09-19 DIAGNOSIS — N182 Chronic kidney disease, stage 2 (mild): Secondary | ICD-10-CM | POA: Diagnosis not present

## 2023-09-19 DIAGNOSIS — E1129 Type 2 diabetes mellitus with other diabetic kidney complication: Secondary | ICD-10-CM | POA: Diagnosis not present

## 2023-09-19 DIAGNOSIS — I129 Hypertensive chronic kidney disease with stage 1 through stage 4 chronic kidney disease, or unspecified chronic kidney disease: Secondary | ICD-10-CM | POA: Diagnosis not present

## 2023-09-19 DIAGNOSIS — E785 Hyperlipidemia, unspecified: Secondary | ICD-10-CM | POA: Diagnosis not present

## 2023-09-19 DIAGNOSIS — E291 Testicular hypofunction: Secondary | ICD-10-CM | POA: Diagnosis not present

## 2023-10-17 ENCOUNTER — Ambulatory Visit (AMBULATORY_SURGERY_CENTER): Admitting: *Deleted

## 2023-10-17 ENCOUNTER — Encounter: Payer: Self-pay | Admitting: Gastroenterology

## 2023-10-17 VITALS — Ht 76.0 in | Wt 250.0 lb

## 2023-10-17 DIAGNOSIS — Z1211 Encounter for screening for malignant neoplasm of colon: Secondary | ICD-10-CM

## 2023-10-17 MED ORDER — NA SULFATE-K SULFATE-MG SULF 17.5-3.13-1.6 GM/177ML PO SOLN: 1.0000 | Freq: Once | ORAL | 0 refills | Status: AC

## 2023-10-17 NOTE — Progress Notes (Signed)
 Pt's name and DOB verified at the beginning of the pre-visit with 2 identifiers  Pt denies any difficulty with ambulating,sitting, laying down or rolling side to side   Pt uses ambulation assistance device or has issues with mobiiity  Pt has no issues moving head neck or swallowing  No egg or soy allergy known to patient   No issues known to pt with past sedation with any surgeries or procedures  No FH of Malignant Hyperthermia  Pt is not on home 02   Pt is not on blood thinners   Pt denies issues with constipation   Pt is not on dialysis  Pt denise any abnormal heart rhythms   Pt denies any upcoming cardiac testing  Patient's chart reviewed by Norleen Schillings CNRA prior to pre-visit and patient appropriate for the [facility].  Pre-visit completed and red dot placed by patient's name on their procedure day (on provider's schedule).    Visit by phone  Pt states weight is 250 lb   Instructions reviewed. Pt given  both [facility] main # and MD on call # prior to instructions.  Informed pt to come in at the time on instructions. Informed pt that it is an hour prior to the actual procedure time and it is  due to  the need to place IV, change into gown, check and sign concent as well as meet CRNA and MD. Pt states they understand.  Pt states understanding after opportunity to ask questions the instructions given. Instructed pt to review instructions again prior to procedure and call main # given if has any questions.. Pt states they will.   Instructed pt on where to find instructions on My Chart.

## 2023-10-19 ENCOUNTER — Other Ambulatory Visit: Payer: Self-pay | Admitting: Acute Care

## 2023-10-19 DIAGNOSIS — Z122 Encounter for screening for malignant neoplasm of respiratory organs: Secondary | ICD-10-CM

## 2023-10-19 DIAGNOSIS — F17210 Nicotine dependence, cigarettes, uncomplicated: Secondary | ICD-10-CM

## 2023-10-19 DIAGNOSIS — Z87891 Personal history of nicotine dependence: Secondary | ICD-10-CM

## 2023-11-06 ENCOUNTER — Telehealth: Payer: Self-pay | Admitting: Gastroenterology

## 2023-11-06 DIAGNOSIS — J40 Bronchitis, not specified as acute or chronic: Secondary | ICD-10-CM | POA: Diagnosis not present

## 2023-11-06 DIAGNOSIS — J329 Chronic sinusitis, unspecified: Secondary | ICD-10-CM | POA: Diagnosis not present

## 2023-11-06 DIAGNOSIS — B9689 Other specified bacterial agents as the cause of diseases classified elsewhere: Secondary | ICD-10-CM | POA: Diagnosis not present

## 2023-11-06 DIAGNOSIS — M542 Cervicalgia: Secondary | ICD-10-CM | POA: Diagnosis not present

## 2023-11-06 NOTE — Telephone Encounter (Signed)
 Thank you for the update.  Agree with reschedule for illness.  We do not want to sedate a patient if active respiratory infection.  No cancellation charge due to illness.  - H. Danis

## 2023-11-06 NOTE — Telephone Encounter (Signed)
 Called and spoke with pt's wife. He was started on prednisone and antibiotics. Sent new instructions to pt's MyChart. Made wife aware and reminded her of 7 day hold of Mounjaro, reviewed date of last dose. She verbalized understanding.

## 2023-11-06 NOTE — Telephone Encounter (Signed)
 Goodmorning Dr.Danis,  Patients wife called stating she will have to cancel and reschedule husbands procedure for tomorrow 11/07/23 at 10am due to husband not feeling well and having to take him to urgent care for his bad cough and congestion. Wife stated they may prescribe him antibiotics so she rescheduled procedure to 12/26/23 at 10am. Wife would like to know if they would be charged for cancellation  Please advise  Thank you

## 2023-11-07 ENCOUNTER — Encounter: Admitting: Gastroenterology

## 2023-11-14 ENCOUNTER — Ambulatory Visit

## 2023-11-17 DIAGNOSIS — Z1212 Encounter for screening for malignant neoplasm of rectum: Secondary | ICD-10-CM | POA: Diagnosis not present

## 2023-11-17 DIAGNOSIS — Z1389 Encounter for screening for other disorder: Secondary | ICD-10-CM | POA: Diagnosis not present

## 2023-11-17 DIAGNOSIS — E291 Testicular hypofunction: Secondary | ICD-10-CM | POA: Diagnosis not present

## 2023-11-20 DIAGNOSIS — E785 Hyperlipidemia, unspecified: Secondary | ICD-10-CM | POA: Diagnosis not present

## 2023-11-20 DIAGNOSIS — E1129 Type 2 diabetes mellitus with other diabetic kidney complication: Secondary | ICD-10-CM | POA: Diagnosis not present

## 2023-11-20 DIAGNOSIS — I10 Essential (primary) hypertension: Secondary | ICD-10-CM | POA: Diagnosis not present

## 2023-11-21 ENCOUNTER — Ambulatory Visit
Admission: RE | Admit: 2023-11-21 | Discharge: 2023-11-21 | Disposition: A | Discharge status: Home / Self Care | Source: Ambulatory Visit | Attending: Acute Care | Admitting: Acute Care

## 2023-11-21 DIAGNOSIS — F17210 Nicotine dependence, cigarettes, uncomplicated: Secondary | ICD-10-CM | POA: Insufficient documentation

## 2023-11-21 DIAGNOSIS — Z87891 Personal history of nicotine dependence: Secondary | ICD-10-CM | POA: Diagnosis not present

## 2023-11-21 DIAGNOSIS — Z122 Encounter for screening for malignant neoplasm of respiratory organs: Secondary | ICD-10-CM | POA: Diagnosis not present

## 2023-11-27 DIAGNOSIS — I129 Hypertensive chronic kidney disease with stage 1 through stage 4 chronic kidney disease, or unspecified chronic kidney disease: Secondary | ICD-10-CM | POA: Diagnosis not present

## 2023-11-27 DIAGNOSIS — E1122 Type 2 diabetes mellitus with diabetic chronic kidney disease: Secondary | ICD-10-CM | POA: Diagnosis not present

## 2023-11-27 DIAGNOSIS — Z1331 Encounter for screening for depression: Secondary | ICD-10-CM | POA: Diagnosis not present

## 2023-11-27 DIAGNOSIS — R82998 Other abnormal findings in urine: Secondary | ICD-10-CM | POA: Diagnosis not present

## 2023-11-27 DIAGNOSIS — Z1339 Encounter for screening examination for other mental health and behavioral disorders: Secondary | ICD-10-CM | POA: Diagnosis not present

## 2023-11-27 DIAGNOSIS — Z0000 Encounter for general adult medical examination without abnormal findings: Secondary | ICD-10-CM | POA: Diagnosis not present

## 2023-12-04 ENCOUNTER — Other Ambulatory Visit: Payer: Self-pay

## 2023-12-04 DIAGNOSIS — Z122 Encounter for screening for malignant neoplasm of respiratory organs: Secondary | ICD-10-CM

## 2023-12-04 DIAGNOSIS — Z87891 Personal history of nicotine dependence: Secondary | ICD-10-CM

## 2023-12-25 ENCOUNTER — Telehealth: Payer: Self-pay | Admitting: Gastroenterology

## 2023-12-25 DIAGNOSIS — Z1211 Encounter for screening for malignant neoplasm of colon: Secondary | ICD-10-CM

## 2023-12-25 NOTE — Telephone Encounter (Signed)
New prep instructions sent to patient.

## 2023-12-25 NOTE — Telephone Encounter (Signed)
 Good morning Dr. Legrand,    Patient's wife called requesting to reschedule patient's colonoscopy scheduled for tomorrow 10/14. States patent ate food on restricted diet. States she called sometime last week but did not hear anything back to be advise if patient still could continue with colonoscopy. Patient has been rescheduled for 10/29. Also requesting new prep instructions.  Thank you

## 2023-12-26 ENCOUNTER — Encounter: Admitting: Gastroenterology

## 2024-01-01 ENCOUNTER — Telehealth: Payer: Self-pay | Admitting: Gastroenterology

## 2024-01-01 NOTE — Telephone Encounter (Signed)
 Understood, and thank you for the note.  Please cancel the 01/10/2024 colonoscopy with me if not already done.  VEAR Brand MD

## 2024-01-01 NOTE — Telephone Encounter (Signed)
 Good morning Dr. Legrand,  I received a call from this patient wife stating that he husband would like to cancel his colonoscopy procedure due to him trying to find another GI provider closer to where he lives. Patient was scheduled to have a colonoscopy on the 29 th of October. Please advise  Thank you.

## 2024-01-03 DIAGNOSIS — E119 Type 2 diabetes mellitus without complications: Secondary | ICD-10-CM | POA: Diagnosis not present

## 2024-01-10 ENCOUNTER — Encounter: Admitting: Gastroenterology

## 2024-01-18 DIAGNOSIS — F17210 Nicotine dependence, cigarettes, uncomplicated: Secondary | ICD-10-CM | POA: Diagnosis not present

## 2024-01-18 DIAGNOSIS — Z23 Encounter for immunization: Secondary | ICD-10-CM | POA: Diagnosis not present

## 2024-01-18 DIAGNOSIS — E1129 Type 2 diabetes mellitus with other diabetic kidney complication: Secondary | ICD-10-CM | POA: Diagnosis not present

## 2024-04-19 ENCOUNTER — Emergency Department
Admission: EM | Admit: 2024-04-19 | Discharge: 2024-04-19 | Discharge status: Against Medical Advice | Source: Home / Self Care

## 2024-04-30 ENCOUNTER — Encounter: Admission: RE | Payer: Self-pay | Source: Home / Self Care

## 2024-04-30 ENCOUNTER — Ambulatory Visit: Admit: 2024-04-30 | Admitting: Internal Medicine
